# Patient Record
Sex: Female | Born: 2000 | Race: Black or African American | Hispanic: No | Marital: Single
Health system: Midwestern US, Community
[De-identification: ages and names within clinical notes are randomized; demographics above are authoritative.]

## PROBLEM LIST (undated history)

## (undated) HISTORY — PX: NO PAST SURGERIES: SHX2092

---
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
## 2019-01-01 ENCOUNTER — Emergency Department
Admission: EM | Admit: 2019-01-01 | Discharge: 2019-01-01 | Disposition: A | Discharge status: Home / Self Care | Payer: Medicaid - Out of State | Attending: Emergency Medicine | Admitting: Emergency Medicine

## 2019-01-01 ENCOUNTER — Encounter: Payer: Self-pay | Admitting: Emergency Medicine

## 2019-01-01 ENCOUNTER — Other Ambulatory Visit: Payer: Self-pay

## 2019-01-01 DIAGNOSIS — F17210 Nicotine dependence, cigarettes, uncomplicated: Secondary | ICD-10-CM | POA: Diagnosis not present

## 2019-01-01 DIAGNOSIS — J209 Acute bronchitis, unspecified: Secondary | ICD-10-CM | POA: Diagnosis not present

## 2019-01-01 DIAGNOSIS — R05 Cough: Secondary | ICD-10-CM | POA: Diagnosis present

## 2019-01-01 MED ORDER — BENZONATATE 100 MG PO CAPS
200.0000 mg | ORAL_CAPSULE | Freq: Once | ORAL | Status: AC
  Administered 2019-01-01: 200 mg via ORAL
  Filled 2019-01-01: qty 2

## 2019-01-01 MED ORDER — BENZONATATE 100 MG PO CAPS: 100.0000 mg | ORAL_CAPSULE | Freq: Three times a day (TID) | ORAL | 0 refills | Status: DC | PRN

## 2019-01-01 MED ORDER — IBUPROFEN 600 MG PO TABS
600.0000 mg | ORAL_TABLET | Freq: Once | ORAL | Status: AC
  Administered 2019-01-01: 600 mg via ORAL
  Filled 2019-01-01: qty 1

## 2019-01-01 MED ORDER — PROMETHAZINE HCL 25 MG PO TABS: 25.0000 mg | ORAL_TABLET | Freq: Four times a day (QID) | ORAL | 0 refills | Status: DC | PRN

## 2019-01-01 MED ORDER — IBUPROFEN 600 MG PO TABS: 600.0000 mg | ORAL_TABLET | Freq: Three times a day (TID) | ORAL | 0 refills | Status: DC | PRN

## 2019-01-01 MED ORDER — AZITHROMYCIN 250 MG PO TABS: ORAL_TABLET | ORAL | 0 refills | Status: AC

## 2019-01-01 MED ORDER — ONDANSETRON 8 MG PO TBDP
8.0000 mg | ORAL_TABLET | Freq: Once | ORAL | Status: AC
  Administered 2019-01-01: 8 mg via ORAL
  Filled 2019-01-01: qty 1

## 2019-01-01 NOTE — ED Triage Notes (Signed)
Patient ambulatory to triage with steady gait, without difficulty or distress noted; pt reports since last night having cough and post tussive emesis with body aches and fever

## 2019-01-01 NOTE — ED Notes (Signed)
Topez not working 

## 2019-01-01 NOTE — ED Notes (Signed)
Pt c/o cough for about a week, pt reports cough is productive, using Dayquil OTC, reports running fever recently

## 2019-01-01 NOTE — ED Provider Notes (Signed)
[facility] Emergency Department Provider Note   ____________________________________________   First MD Initiated Contact with Patient 01/01/19 [phone]     (approximate)  I have reviewed the triage vital signs and the nursing notes.   HISTORY  Chief Complaint Cough    HPI Tonya Koch is a 18 y.o. female patient presents with 1 week of persistent productive cough.  Patient also complained of body aches, fever, and vomiting secondary to coughing spells.  Patient denies diarrhea.  Patient had taken flu shot for this season.  Patient rates her pain discomfort a 6/10.  Patient describes her pain as "achy".  No palliative measure for complaint.    History reviewed. No pertinent past medical history.  There are no active problems to display for this patient.   History reviewed. No pertinent surgical history.  Prior to Admission medications   Medication Sig Start Date End Date Taking? Authorizing Provider  azithromycin (ZITHROMAX Z-PAK) 250 MG tablet Take 2 tablets (500 mg) on  Day 1,  followed by 1 tablet (250 mg) once daily on Days 2 through 5. 01/01/19 01/06/19  Joni Reining, PA-C  benzonatate (TESSALON PERLES) 100 MG capsule Take 1 capsule (100 mg total) by mouth 3 (three) times daily as needed for cough. 01/01/19   Joni Reining, PA-C  ibuprofen (ADVIL,MOTRIN) 600 MG tablet Take 1 tablet (600 mg total) by mouth every 8 (eight) hours as needed. 01/01/19   Joni Reining, PA-C  promethazine (PHENERGAN) 25 MG tablet Take 1 tablet (25 mg total) by mouth every 6 (six) hours as needed for nausea or vomiting. 01/01/19   Joni Reining, PA-C    Allergies Patient has no known allergies.  No family history on file.  Social History Social History   Tobacco Use  . Smoking status: Current Every Day Smoker  . Smokeless tobacco: Never Used  Substance Use Topics  . Alcohol use: Not on file  . Drug use: Not on file    Review of Systems Constitutional:  Subjective fever.  Body aches.  Eyes: No visual changes. ENT: Sore throat and nasal congestion. Cardiovascular: Denies chest pain. Respiratory: Denies shortness of breath. Gastrointestinal: No abdominal pain.  No nausea, no vomiting.  No diarrhea.  No constipation. Genitourinary: Negative for dysuria. Musculoskeletal: Negative for back pain. Skin: Negative for rash. Neurological: Negative for headaches, focal weakness or numbness.   ____________________________________________   PHYSICAL EXAM:  VITAL SIGNS: ED Triage Vitals  Enc Vitals Group     BP 01/01/19 0656 105/71     Pulse Rate 01/01/19 0656 83     Resp 01/01/19 0656 18     Temp 01/01/19 0656 98.3 F (36.8 C)     Temp Source 01/01/19 0656 Oral     SpO2 01/01/19 0656 97 %     Weight 01/01/19 0653 100 lb (45.4 kg)     Height 01/01/19 0653 5\' 3"  (1.6 m)     Head Circumference --      Peak Flow --      Pain Score 01/01/19 0653 6     Pain Loc --      Pain Edu? --      Excl. in GC? --    Constitutional: Alert and oriented. Well appearing and in no acute distress. Eyes: Conjunctivae are normal. PERRL. EOMI. Head: Atraumatic. Nose: Clear rhinorrhea. Mouth/Throat: Mucous membranes are moist.  Oropharynx non-erythematous.  Postnasal drainage. Neck: No stridor.  Hematological/Lymphatic/Immunilogical: No cervical lymphadenopathy. Cardiovascular: Normal rate, regular rhythm.  Grossly normal heart sounds.  Good peripheral circulation. Respiratory: Normal respiratory effort.  No retractions. Lungs bilateral Rales. Gastrointestinal: Soft and nontender. No distention. No abdominal bruits. No CVA tenderness. Musculoskeletal: No lower extremity tenderness nor edema.  No joint effusions. Neurologic:  Normal speech and language. No gross focal neurologic deficits are appreciated. No gait instability. Skin:  Skin is warm, dry and intact. No rash noted. Psychiatric: Mood and affect are normal. Speech and behavior are  normal.  ____________________________________________   LABS (all labs ordered are listed, but only abnormal results are displayed)  Labs Reviewed - No data to display ____________________________________________  EKG   ____________________________________________  RADIOLOGY  ED MD interpretation:    Official radiology report(s): No results found.  ____________________________________________   PROCEDURES  Procedure(s) performed: None  Procedures  Critical Care performed: No  ____________________________________________   INITIAL IMPRESSION / ASSESSMENT AND PLAN / ED COURSE  As part of my medical decision making, I reviewed the following data within the electronic MEDICAL RECORD NUMBER     Patient presents 1 week of cough, nasal and chest congestion.  Patient physical exam is consistent with bronchitis.  Patient given discharge care instruction and advised [facility].  Take medication as directed.      ____________________________________________   FINAL CLINICAL IMPRESSION(S) / ED DIAGNOSES  Final diagnoses:  Acute bronchitis, unspecified organism     ED Discharge Orders         Ordered    benzonatate (TESSALON PERLES) 100 MG capsule  3 times daily PRN     01/01/19 0715    promethazine (PHENERGAN) 25 MG tablet  Every 6 hours PRN     01/01/19 0715    azithromycin (ZITHROMAX Z-PAK) 250 MG tablet     01/01/19 0715    ibuprofen (ADVIL,MOTRIN) 600 MG tablet  Every 8 hours PRN     01/01/19 0715           Note:  This document was prepared using Dragon voice recognition software and may include unintentional dictation errors.    Joni Reining, PA-C 01/01/19 0720    Arnaldo Natal, MD 01/01/19 [phone]

## 2019-01-16 ENCOUNTER — Encounter: Payer: Self-pay | Admitting: Emergency Medicine

## 2019-01-16 ENCOUNTER — Emergency Department: Payer: Medicaid - Out of State

## 2019-01-16 DIAGNOSIS — Z5321 Procedure and treatment not carried out due to patient leaving prior to being seen by health care provider: Secondary | ICD-10-CM | POA: Diagnosis not present

## 2019-01-16 DIAGNOSIS — R51 Headache: Secondary | ICD-10-CM | POA: Insufficient documentation
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
## 2019-01-17 ENCOUNTER — Emergency Department
Admission: EM | Admit: 2019-01-17 | Discharge: 2019-01-17 | Disposition: A | Discharge status: Home / Self Care | Payer: Medicaid - Out of State | Attending: Emergency Medicine | Admitting: Emergency Medicine

## 2019-04-08 ENCOUNTER — Encounter: Payer: Self-pay | Admitting: Emergency Medicine

## 2019-04-08 ENCOUNTER — Other Ambulatory Visit: Payer: Self-pay

## 2019-04-08 ENCOUNTER — Ambulatory Visit
Admission: EM | Admit: 2019-04-08 | Discharge: 2019-04-08 | Disposition: A | Discharge status: Home / Self Care | Payer: PRIVATE HEALTH INSURANCE | Attending: Family Medicine | Admitting: Family Medicine

## 2019-04-08 DIAGNOSIS — N760 Acute vaginitis: Secondary | ICD-10-CM

## 2019-04-08 DIAGNOSIS — B9689 Other specified bacterial agents as the cause of diseases classified elsewhere: Secondary | ICD-10-CM

## 2019-04-08 LAB — URINALYSIS, COMPLETE (UACMP) WITH MICROSCOPIC
Bilirubin Urine: NEGATIVE
Glucose, UA: NEGATIVE mg/dL
Hgb urine dipstick: NEGATIVE
Ketones, ur: NEGATIVE mg/dL
Leukocytes,Ua: NEGATIVE
Nitrite: NEGATIVE
Protein, ur: NEGATIVE mg/dL
RBC / HPF: NONE SEEN RBC/hpf (ref 0–5)
Specific Gravity, Urine: 1.025 (ref 1.005–1.030)
pH: 6 (ref 5.0–8.0)

## 2019-04-08 LAB — WET PREP, GENITAL
Sperm: NONE SEEN
Trich, Wet Prep: NONE SEEN
Yeast Wet Prep HPF POC: NONE SEEN

## 2019-04-08 LAB — CHLAMYDIA/NGC RT PCR (ARMC ONLY)
Chlamydia Tr: NOT DETECTED
N gonorrhoeae: NOT DETECTED

## 2019-04-08 MED ORDER — METRONIDAZOLE 500 MG PO TABS: 500.0000 mg | ORAL_TABLET | Freq: Two times a day (BID) | ORAL | 0 refills | Status: AC

## 2019-04-08 NOTE — ED Provider Notes (Addendum)
[facility]    CSN: [id number] Arrival date & time: 04/08/19  1020  History   Chief Complaint Chief Complaint  Patient presents with  . Vaginal Discharge   HPI  18 year old female presents with vaginal discharge.  3-day history of vaginal discharge.  White discharge.  Associated itching.  She is sexually active.  She desires STD testing today.  No urinary symptoms.  No abdominal pain.  No medications or interventions tried.  No other associated symptoms.  No other complaints.  History reviewed and updated as below. No significant PMH.  Past Surgical History:  Procedure Laterality Date  . NO PAST SURGERIES     OB History   No obstetric history on file.    Home Medications    Prior to Admission medications   Medication Sig Start Date End Date Taking? Authorizing Provider  metroNIDAZOLE (FLAGYL) 500 MG tablet Take 1 tablet (500 mg total) by mouth 2 (two) times daily. 04/08/19   Tommie Sams, DO   Family History Family History  Problem Relation Age of Onset  . Healthy Mother   . Healthy Father    Social History Social History   Tobacco Use  . Smoking status: Former Games developer  . Smokeless tobacco: Never Used  Substance Use Topics  . Alcohol use: Not Currently  . Drug use: Not Currently   Allergies   Patient has no known allergies.  Review of Systems Review of Systems  Constitutional: Negative.   Gastrointestinal: Negative.   Genitourinary: Positive for vaginal discharge.   Physical Exam Triage Vital Signs ED Triage Vitals  Enc Vitals Group     BP 04/08/19 1100 99/62     Pulse Rate 04/08/19 1100 62     Resp 04/08/19 1100 18     Temp 04/08/19 1100 98.2 F (36.8 C)     Temp Source 04/08/19 1100 Oral     SpO2 04/08/19 1100 100 %     Weight 04/08/19 1057 110 lb (49.9 kg)     Height 04/08/19 1057 5\' 4"  (1.626 m)     Head Circumference --      Peak Flow --      Pain Score 04/08/19 1057 0     Pain Loc --      Pain Edu? --      Excl. in GC? --     No data found.  Updated Vital Signs BP 99/62 (BP Location: Left Arm)   Pulse 62   Temp 98.2 F (36.8 C) (Oral)   Resp 18   Ht 5\' 4"  (1.626 m)   Wt 49.9 kg   LMP 03/08/2019 (Approximate)   SpO2 100%   BMI 18.88 kg/m   Visual Acuity Right Eye Distance:   Left Eye Distance:   Bilateral Distance:    Right Eye Near:   Left Eye Near:    Bilateral Near:     Physical Exam Vitals signs and nursing note reviewed.  Constitutional:      General: She is not in acute distress.    Appearance: Normal appearance.  HENT:     Head: Normocephalic and atraumatic.  Eyes:     General:        Right eye: No discharge.        Left eye: No discharge.     Conjunctiva/sclera: Conjunctivae normal.  Cardiovascular:     Rate and Rhythm: Normal rate and regular rhythm.  Pulmonary:     Effort: Pulmonary effort is normal.     Breath  sounds: Normal breath sounds.  Abdominal:     General: There is no distension.     Palpations: Abdomen is soft.     Tenderness: There is no abdominal tenderness.  Neurological:     Mental Status: She is alert.  Psychiatric:        Mood and Affect: Mood normal.        Behavior: Behavior normal.    UC Treatments / Results  Labs (all labs ordered are listed, but only abnormal results are displayed) Labs Reviewed  CHLAMYDIA/NGC RT PCR ([facility] ONLY)  WET PREP, GENITAL  URINALYSIS, COMPLETE (UACMP) WITH MICROSCOPIC   EKG None  Radiology No results found.  Procedures Procedures (including critical care time)  Medications Ordered in UC Medications - No data to display  Initial Impression / Assessment and Plan / UC Course  I have reviewed the triage vital signs and the nursing notes.  Pertinent labs & imaging results that were available during my care of the patient were reviewed by me and considered in my medical decision making (see chart for details).    18 year old female presents with bacterial vaginosis.  Awaiting STD testing.  Treating  with Flagyl.  Final Clinical Impressions(s) / UC Diagnoses   Final diagnoses:  BV (bacterial vaginosis)   Discharge Instructions   None    ED Prescriptions    Medication Sig Dispense Auth. Provider   metroNIDAZOLE (FLAGYL) 500 MG tablet Take 1 tablet (500 mg total) by mouth 2 (two) times daily. 14 tablet Tommie Samsook, Abdulai Blaylock G, DO     Controlled Substance Prescriptions [place] Controlled Substance Registry consulted? Not Applicable   Tommie SamsCook, Tasia Liz G, DO 04/08/19 1148

## 2019-04-08 NOTE — ED Triage Notes (Signed)
Pt c/o vaginal discharge, and itching. Started about 3 days ago.
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
